# Patient Record
Sex: Female | Born: 1980 | Hispanic: No | Marital: Married | Smoking: Never smoker
Health system: Southern US, Community
[De-identification: ages and names within clinical notes are randomized; demographics above are authoritative.]

## PROBLEM LIST (undated history)

## (undated) DIAGNOSIS — T7840XA Allergy, unspecified, initial encounter: Secondary | ICD-10-CM

## (undated) HISTORY — DX: Allergy, unspecified, initial encounter: T78.40XA

---

## 2006-01-08 HISTORY — PX: APPENDECTOMY: SHX54

## 2006-12-22 ENCOUNTER — Inpatient Hospital Stay (HOSPITAL_COMMUNITY): Admission: EM | Admit: 2006-12-22 | Discharge: 2006-12-23 | Payer: Self-pay | Admitting: Emergency Medicine

## 2006-12-22 ENCOUNTER — Encounter (INDEPENDENT_AMBULATORY_CARE_PROVIDER_SITE_OTHER): Payer: Self-pay | Admitting: General Surgery

## 2008-05-03 ENCOUNTER — Inpatient Hospital Stay (HOSPITAL_COMMUNITY): Admission: AD | Admit: 2008-05-03 | Discharge: 2008-05-03 | Payer: Self-pay | Admitting: Obstetrics and Gynecology

## 2008-05-03 ENCOUNTER — Ambulatory Visit: Payer: Self-pay | Admitting: Obstetrics and Gynecology

## 2008-05-05 ENCOUNTER — Ambulatory Visit: Payer: Self-pay | Admitting: Advanced Practice Midwife

## 2008-05-05 ENCOUNTER — Inpatient Hospital Stay (HOSPITAL_COMMUNITY): Admission: AD | Admit: 2008-05-05 | Discharge: 2008-05-05 | Payer: Self-pay | Admitting: Obstetrics and Gynecology

## 2008-05-15 ENCOUNTER — Inpatient Hospital Stay (HOSPITAL_COMMUNITY): Admission: AD | Admit: 2008-05-15 | Discharge: 2008-05-15 | Payer: Self-pay | Admitting: Obstetrics and Gynecology

## 2008-05-17 ENCOUNTER — Inpatient Hospital Stay (HOSPITAL_COMMUNITY): Admission: AD | Admit: 2008-05-17 | Discharge: 2008-05-20 | Payer: Self-pay | Admitting: Obstetrics and Gynecology

## 2009-03-07 IMAGING — CT CT ABDOMEN W/ CM
2 of 3 series · 17 of 42 positions shown, 19 images · IV contrast (omnipaque)
Comparison: None.

CLINICAL DATA: Upper abdominal pain with nausea and vomiting.
ABDOMEN CT WITH CONTRAST:
TECHNIQUE: Multidetector CT imaging of the abdomen was performed following the standard protocol during bolus administration of intravenous contrast.
Contrast:  125 cc Omnipaque 300 and oral contrast.
TECHNIQUE: Multidetector CT imaging of the pelvis was performed following the standard protocol during bolus administration of intravenous contrast.

[Series 5: abd_pel 5.0 b60f lung · axial · 0.56mm/px · z∈[-160,-34]mm · 14 of 28 slices shown, 16 images]
[im 2/28  soft-tissue]
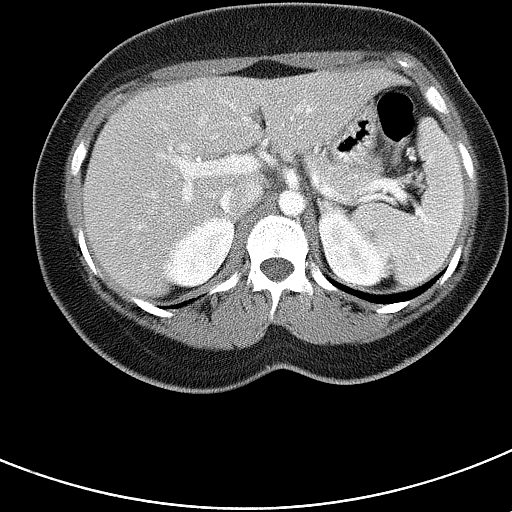
[im 2/28  bone]
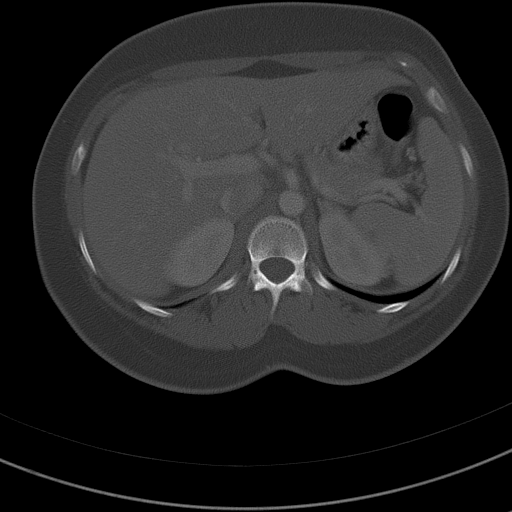
[im 4/28  soft-tissue]
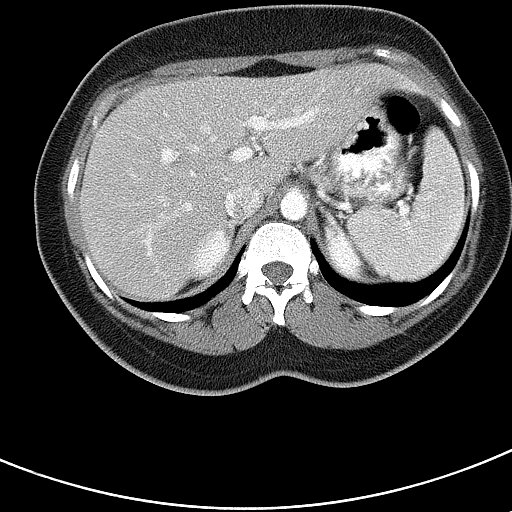
[im 6/28  soft-tissue]
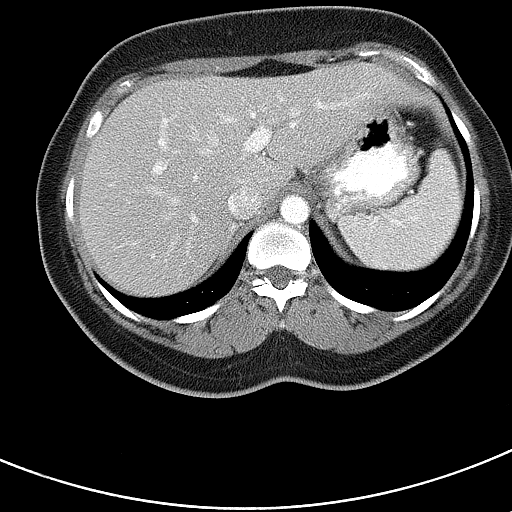
[im 8/28  soft-tissue]
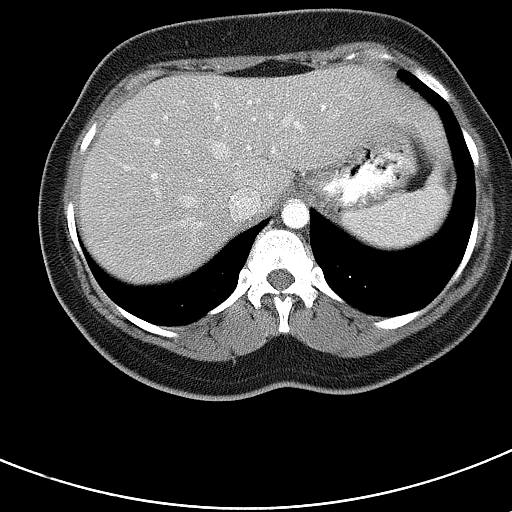
[im 10/28  soft-tissue]
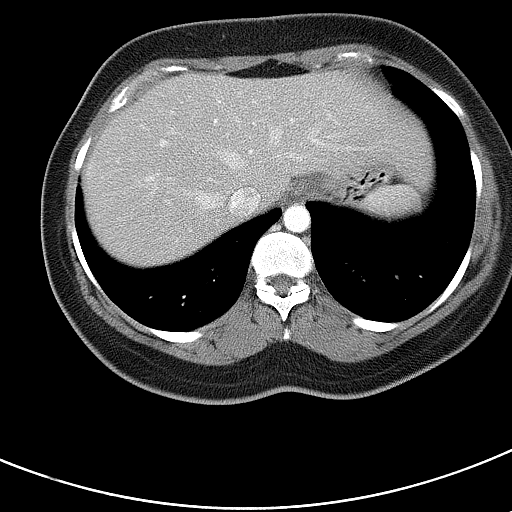
[im 12/28  soft-tissue]
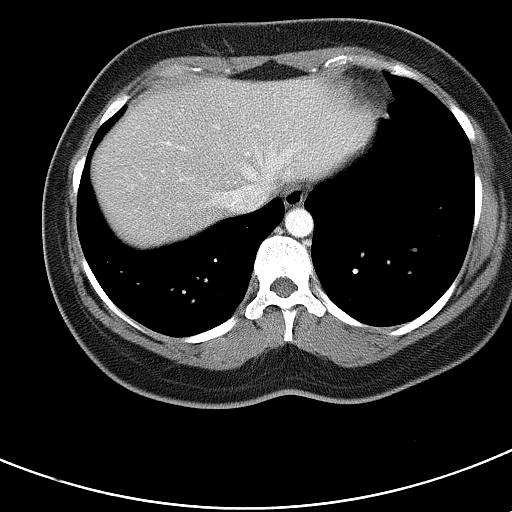
[im 14/28  soft-tissue]
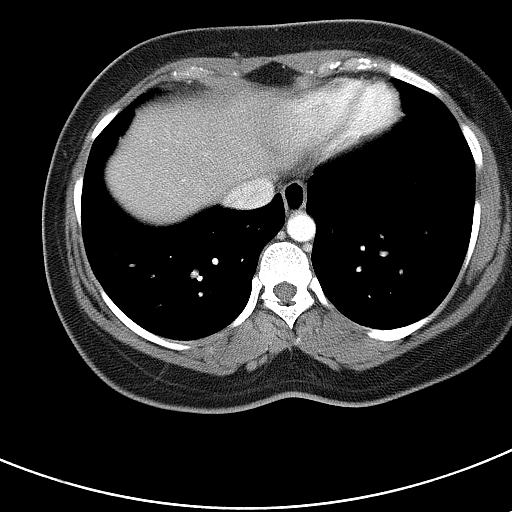
[im 15/28  soft-tissue]
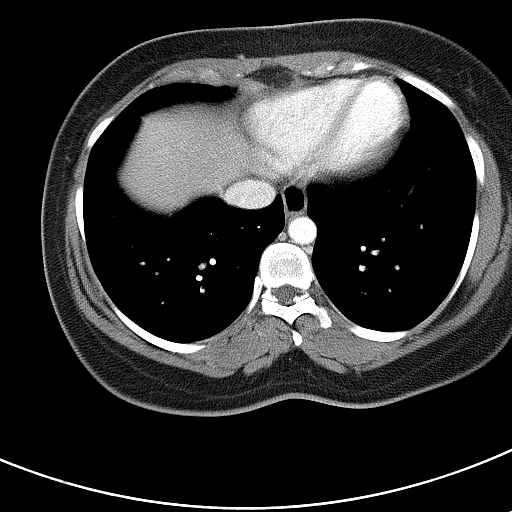
[im 17/28  soft-tissue]
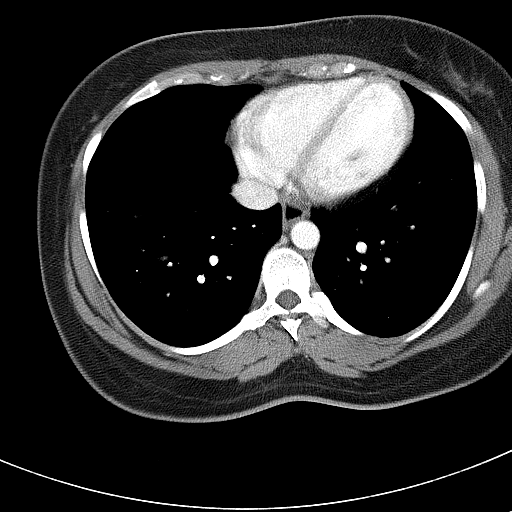
[im 17/28  bone]
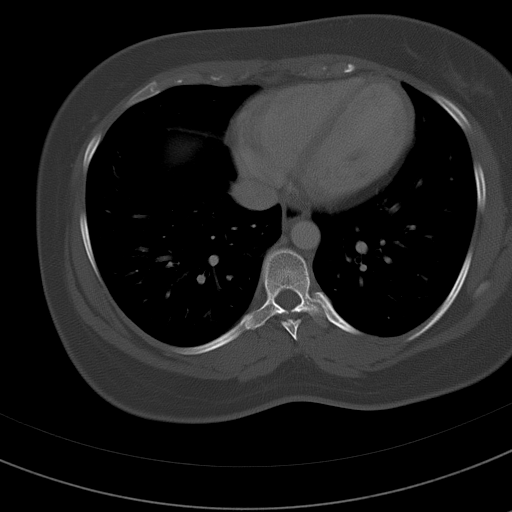
[im 19/28  soft-tissue]
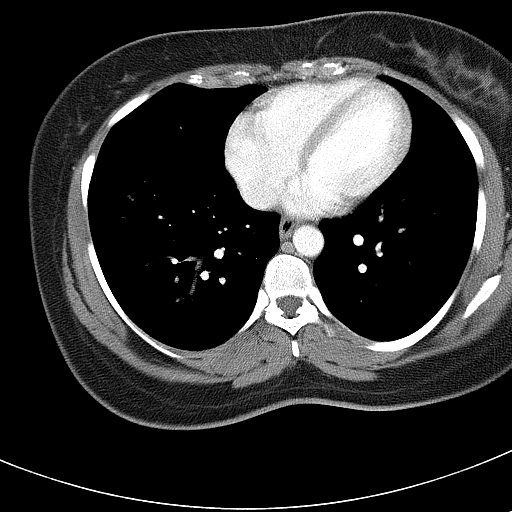
[im 21/28  soft-tissue]
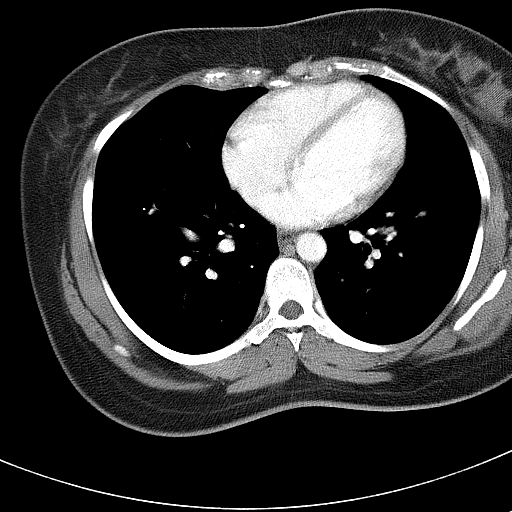
[im 23/28  soft-tissue]
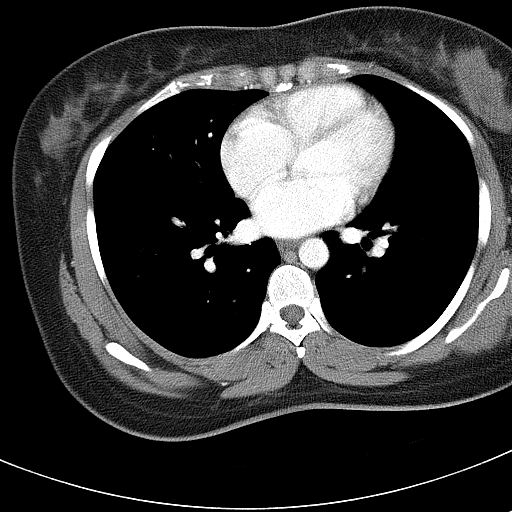
[im 25/28  soft-tissue]
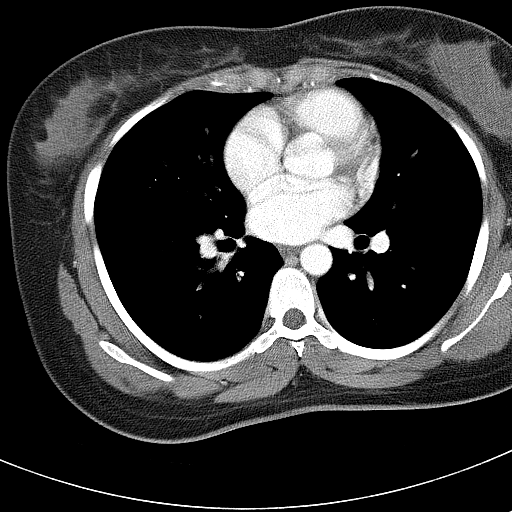
[im 27/28  soft-tissue]
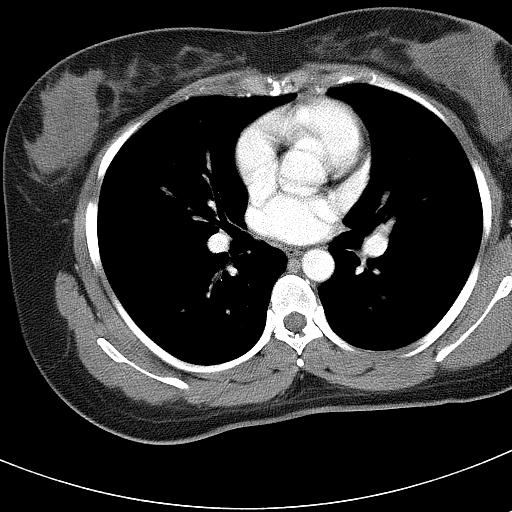

[Series 602: coronal · coronal · 0.87mm/px · 3 of 54 slices shown]
[im 18/54  soft-tissue]
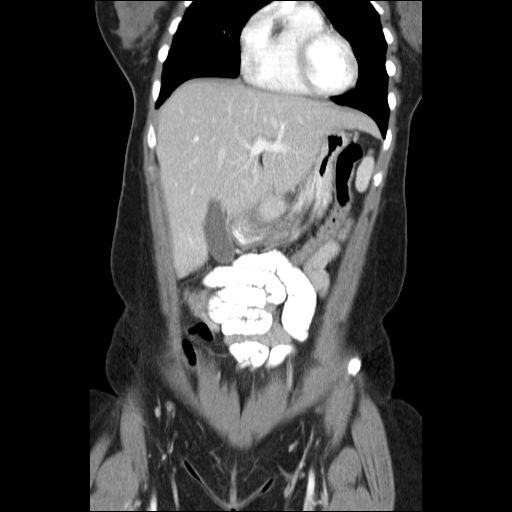
[im 24/54  soft-tissue]
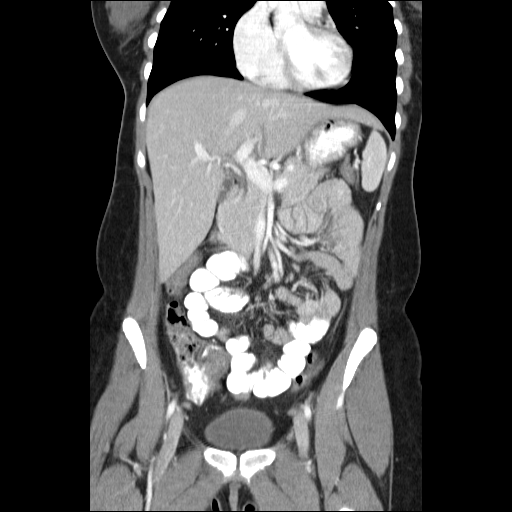
[im 30/54  soft-tissue]
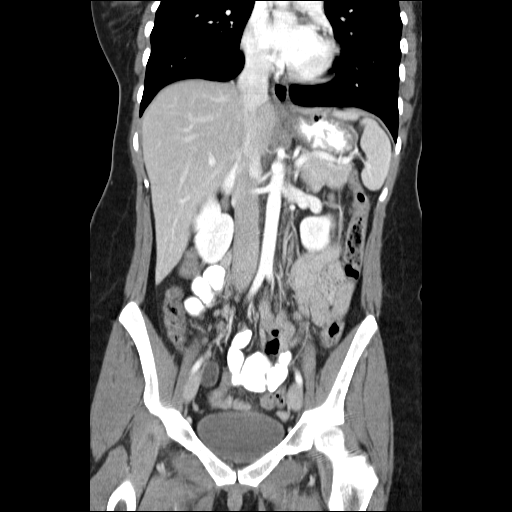

[17 of 42 positions shown; findings below may reference images not displayed]

FINDINGS: The lung bases are clear.  There is no pleural effusion. 
The liver, spleen, gallbladder, pancreas, adrenal glands and kidneys appear normal.  The bowel gas pattern is normal.  There is no evidence of intraabdominal abscess.
IMPRESSION: Negative CT of the abdomen.  See pelvic findings below.
PELVIS CT WITH CONTRAST:
FINDINGS: There is a markedly distended appendix filled with several appendicoliths.  This is demonstrated on images 62 thru 67.  The appendix measures up to 1.3 cm in diameter.  The cecum lies inferiorly in the pelvis, just above the right ovary.  The ovaries appear unremarkable.  The configuration of the uterus suggests a possible septate uterus.  A small amount of free pelvic fluid is present.  There is no evidence of pelvic abscess.
IMPRESSION: 1. Acute appendicitis without evidence of perforation or abscess.
2. Possible septated uterus.
Findings were discussed by telephone with Dr. Xiaojing at the time of interpretation.

## 2010-04-18 LAB — CBC
MCV: 82 fL (ref 78.0–100.0)
RBC: 4.57 MIL/uL (ref 3.87–5.11)
RDW: 18.3 % — ABNORMAL HIGH (ref 11.5–15.5)
WBC: 12 10*3/uL — ABNORMAL HIGH (ref 4.0–10.5)
WBC: 14.6 10*3/uL — ABNORMAL HIGH (ref 4.0–10.5)

## 2010-04-18 LAB — URINALYSIS, ROUTINE W REFLEX MICROSCOPIC
Bilirubin Urine: NEGATIVE
Glucose, UA: NEGATIVE mg/dL
Ketones, ur: NEGATIVE mg/dL
Leukocytes, UA: NEGATIVE

## 2010-04-18 LAB — URINE MICROSCOPIC-ADD ON

## 2010-04-19 LAB — URINE MICROSCOPIC-ADD ON

## 2010-04-19 LAB — URINALYSIS, ROUTINE W REFLEX MICROSCOPIC
Bilirubin Urine: NEGATIVE
Glucose, UA: NEGATIVE mg/dL
Ketones, ur: NEGATIVE mg/dL
Nitrite: NEGATIVE
Protein, ur: NEGATIVE mg/dL
Urobilinogen, UA: 0.2 mg/dL (ref 0.0–1.0)

## 2010-05-23 NOTE — Op Note (Signed)
NAMECODI, FOLKERTS NO.:  [id number]   MEDICAL RECORD NO.:  [id number]          PATIENT TYPE:  INP   LOCATION:  0103                         FACILITY:  [facility]   PHYSICIAN:  Ollen Gross. Vernell Morgans, M.D. DATE OF BIRTH:  1980/05/22   DATE OF PROCEDURE:  12/22/2006  DATE OF DISCHARGE:                               OPERATIVE REPORT   PREOPERATIVE DIAGNOSIS:  Acute appendicitis.   POSTOPERATIVE DIAGNOSIS:  Acute appendicitis.   PROCEDURE:  Laparoscopic appendectomy.   SURGEON:  Ollen Gross. Vernell Morgans, M.D.   ANESTHESIA:  General endotracheal.   PROCEDURE:  After informed consent was obtained, the patient was brought  to the operating room and placed in supine position on the operating  room table.  After adequate induction of general anesthesia, the  patient's abdomen was prepped with Betadine and draped in usual sterile  manner.  The area below the umbilicus infiltrated with 0.25% Marcaine.  A small incision was made with a 15 blade knife.  This incision was  carried down through the subcutaneous tissue bluntly with a hemostat and  Army-Navy retractors until the linea alba was identified.  The linea  alba was incised with a 15 blade knife.  Each side was grasped with  Kocher clamps and elevated anteriorly.  The preperitoneal space was  probed bluntly with a hemostat until the peritoneum was opened and  access was gained to the abdominal cavity.  A 0 Vicryl pursestring  stitch was placed in the fascia surrounding the opening.  A Hasson  cannula was placed through the opening and anchored in place with  previously placed Vicryl pursestring stitch.  The abdomen was then  insufflated carbon dioxide without difficulty.  The patient was placed  in Trendelenburg position and rotated with the right side up.  Next a  suprapubic region was infiltrated with 0.25% Marcaine.  A small incision  was made with a 15 blade knife and a 10 mm port was placed bluntly  through this incision  into the abdominal cavity under direct vision.  The laparoscope was then moved to the suprapubic port.  The right lower  quadrant was inspected with a Glassman grasper and the cecum was  identified and enlarged, inflamed appendix was also identified coming  off the cecum.  Next a 5 mm port site was chosen between the two the  ports.  This area was infiltrated with 0.25% Marcaine.  A small stab  incision was made with a 15 blade knife and 5 mm port was placed bluntly  through this incision into the abdominal cavity under direct vision.  Using a Glassman grasper to grab the appendix and hold up in the air,  the mesoappendix was then taken down sharply with the harmonic scalpel  until the junction of the appendix and the cecum was identified and  cleared of all tissue.  A laparoscopic 45-mm blue load six row stapler  was then placed through the  Hasson cannula across the base the appendix  at its junction with cecum clamped and fired thereby dividing the base  of the appendix between staple lines.  A laparoscopic bag  was then  inserted through the Hasson cannula, the appendix placed within the bag  and bag was sealed.  The bed was inspected.  No other abnormalities were  noted.  The abdomen was irrigated with copious amounts of saline and the  staple line was inspected and found to be hemostatic and intact.  It  looked very good.  The appendix and bag were then removed through the  infraumbilical port with the Hasson cannula without difficulty.  The  fascial defect was closed.  The previously placed Vicryl pursestring  stitch as well as with another figure-of-eight 0 Vicryl stitch.  The  rest of the ports were then removed under direct vision and were found  to be hemostatic.  Gas was allowed to  escape.  The skin incisions were all closed with interrupted 4-0  Monocryl subcuticular stitches.  Dermabond dressings were applied.  The  patient tolerated well.  At the end of the case, all needle,  sponge,  instrument counts correct.  The patient was awakened, taken to recovery  in stable condition.      Ollen Gross. Vernell Morgans, M.D.  Electronically Signed     PST/MEDQ  D:  12/22/2006  T:  12/23/2006  Job:  [id number]

## 2010-05-23 NOTE — H&P (Signed)
NAMEALICYN, KLANN NO.:  [id number]   MEDICAL RECORD NO.:  [id number]          PATIENT TYPE:  INP   LOCATION:  1604                         FACILITY:  [facility]   PHYSICIAN:  Ollen Gross. Vernell Morgans, M.D. DATE OF BIRTH:  11-04-1980   DATE OF ADMISSION:  12/22/2006  DATE OF DISCHARGE:                              HISTORY & PHYSICAL   Ms. Marco Collie is a 30 year old female who presents with right lower  quadrant pain that started this morning. The pain has gotten worse  throughout the day.  The pain has been associated with significant  nausea and vomiting.  She denies any fevers.  No chest pain, shortness  of breath, no diarrhea or dysuria.  Her other review of systems are  unremarkable.   PAST MEDICAL HISTORY:  Is none.   PAST SURGICAL HISTORY:  None.   MEDICATIONS:  None.   ALLERGIES:  ARE TO IBUPROFEN AND PENICILLIN.   SOCIAL HISTORY:  She denies use of alcohol or tobacco products.   FAMILY HISTORY:  Is noncontributory.   PHYSICAL EXAM:  VITAL SIGNS:  Temperature is 97, blood pressure 111/47,  pulse 61.  GENERAL:  She is a well-developed, well-nourished female in no acute  distress.  SKIN:  Warm and dry, no jaundice.  EYES:  Extraocular muscles intact.  Pupils equal, round, reactive to  light.  Sclerae nonicteric.  LUNGS:  Clear bilaterally.  No use of accessory or respiratory muscles.  HEART:  Regular rate and rhythm with an impulse in the left chest.  ABDOMEN:  Soft, but she is focally tender with guarding in the right  lower quadrant.  No palpable mass or hepatosplenomegaly.  EXTREMITIES:  No cyanosis, clubbing or edema with good strength in arms and legs.  MENTAL STATUS:  Psychologically, she is alert and oriented x3 with no  evidence of state or anxiety or depression.   IMPRESSION:  On review of her lab work, it was significant for a white  count of 20,300.   Her CT scan was reviewed with the radiologist and did show an enlarged  inflamed  appendix.   ASSESSMENT/PLAN:  This is a 30 year old female with what appears to be  acute appendicitis.  Because of the risk of rupture and sepsis, I think  she would benefit from having her appendix removed.  I have explained  her in  detail the risks and benefits, the operation to remove the appendix, as  well as some of the technical aspects, and she understands and wishes to  proceed.  Will obtain some routine preoperative lab work in preparation  for doing this for her tonight.      Ollen Gross. Vernell Morgans, M.D.  Electronically Signed     PST/MEDQ  D:  12/22/2006  T:  12/23/2006  Job:  [id number]

## 2010-05-23 NOTE — Op Note (Signed)
Deanna Waller, Deanna Waller      ACCOUNT NO.:  [id number]   MEDICAL RECORD NO.:  [id number]          PATIENT TYPE:  INP   LOCATION:  9148                          FACILITY:  [facility]   PHYSICIAN:  Kendra H. Tenny Craw, MD     DATE OF BIRTH:  06/25/1980   DATE OF PROCEDURE:  05/17/2008  DATE OF DISCHARGE:                               OPERATIVE REPORT   PREOPERATIVE DIAGNOSES:  1. A 40- and 2-week intrauterine pregnancy.  2. Failure to descend.   POSTOPERATIVE DIAGNOSES:  1. A 40- and 2-week intrauterine pregnancy.  2. Failure to descend.   PROCEDURE:  Primary low transverse cesarean section via Pfannenstiel  skin incision.   SURGEON:  Freddrick March. Tenny Craw, MD   ASSISTANT:  None.   ANESTHESIA:  Epidural.   OPERATIVE FINDINGS:  Vigorous female infant in the vertex occiput  posterior presentation weighing 7 pounds 9 ounces with Apgar scores of 9  at 1 minute and 10 and 5 minutes.  Normal-appearing ovaries, tubes, and  uterus.   PROCEDURE:  Deanna Waller is a 30 year old G1, P0 who presented in  the early hours of May 17, 2008, in labor.  She made to complete and  pushing.  She pushed for approximately an hour and 15 minutes with  minimal descent of the infant's head.  It was felt that there was  concern for failure to descend.  The discussion was held with the  patient and her husband about proceeding with another hour of pushing  versus proceeding with a primary cesarean section.  Risks, benefits, and  alternatives were discussed with the patient and her husband and they  elected to proceed with primary cesarean section.  Following the  appropriate informed consent, the patient was brought to the operating  room where epidural anesthesia was confirmed to be adequate.  She was  prepped and draped in the normal sterile fashion.  A scalpel was used to  make a Pfannenstiel skin incision which was carried down through the  underlying layers of soft tissue to the fascia.  The fascia was  incised  in the midline.  The fascial incision was extended laterally with Mayo  scissors.  The superior aspect of the fascial incision was grasped with  Kocher clamps x2, tented up, and the underlying rectus muscle was  dissected off sharply with the electrocautery unit.  The same procedure  was repeated on the inferior aspect of the fascial incision.  Rectus  muscles were then separated in the midline.  The abdominal peritoneum  was identified, tented up, entered sharply with the Metzenbaum and the  incision was extended superiorly and inferiorly with good visualization  of the bladder.  The bladder blade was inserted.  The vesicouterine  peritoneum was identified, tented up, entered sharply with the  Metzenbaum.  The incision was extended laterally with the Metzenbaum and  the bladder flap was created digitally.  A scalpel was then used to make  a low transverse incision on the uterus which was extended laterally  with blunt dissection.  The fetal vertex was noted deep in the pelvis  and was delivered through the uterine incision.  The vertex  was noted to  be in the occiput posterior presentation.  The head followed by the body  were delivered.  The infant cried vigorously on the operative field, it  was bulb suctioned.  Cord was clamped and cut and the infant was passed  to the awaiting pediatricians.  The placenta was then spontaneously  delivered.  Uterus was cleared of all clot and debris.  The uterine  incision was repaired with #1 chromic in a running locked fashion with a  second imbricating layer.  Uterine incision was inspected and found to  be hemostatic.  Ovaries and tubes were inspected and found to be normal.  Uterus was returned to the abdominal cavity.  Abdominal cavity was  cleared of all clot and debris.  The abdominal peritoneum was repaired  with 2-0 Vicryl in a running fashion.  The rectus muscle was then  reapproximated with #1 chromic in a running fashion.  The  fascia was  closed with 0 looped PDS in a running fashion and the skin was closed  with staples.  All sponge, lap, needle counts were correct x2.  The  patient tolerated the procedure well and was brought to the recovery  room in stable condition following the procedure.   ESTIMATED BLOOD LOSS:  700 mL.   COMPLICATIONS:  None.      Freddrick March. Tenny Craw, MD  Electronically Signed     KHR/MEDQ  D:  05/17/2008  T:  05/18/2008  Job:  [id number]

## 2010-05-26 NOTE — Discharge Summary (Signed)
Deanna Waller, Deanna Waller      ACCOUNT NO.:  [id number]   MEDICAL RECORD NO.:  [id number]          PATIENT TYPE:  INP   LOCATION:  9148                          FACILITY:  [facility]   PHYSICIAN:  Randye Lobo, M.D.   DATE OF BIRTH:  02/02/1980   DATE OF ADMISSION:  05/17/2008  DATE OF DISCHARGE:  05/20/2008                               DISCHARGE SUMMARY   FINAL DIAGNOSES:  Intrauterine pregnancy at 40-2/7th weeks' gestation,  active labor, failure to descend.   PROCEDURE:  Primary low transverse cesarean section.   SURGEON:  Freddrick March. Tenny Craw, MD   COMPLICATIONS:  None.   This 30 year old, G1, P0, presents early on May 17, 2008, in early labor  with rupture of membranes.  The patient dilated to complete.  She pushed  for about an hour and 15 minutes with minimal descent of the infant's  head.  There was some concern for failure to descend, and discussion was  held with the patient.  Decision was made to proceed with cesarean  section at this time.  The patient's antepartum course otherwise up to  this point had been uncomplicated.  She did have a negative group B  strep culture obtained in our office at 35 weeks.  The patient was taken  to the operating room on May 17, 2008, by Dr. Waynard Reeds where a  primary low transverse cesarean section was performed with the delivery  of a 7-pound 9-ounce female infant with Apgars of 9 and 10.  Delivery  went without complications.  The patient's postoperative course was  benign without any significant fevers.  The patient was felt ready for  discharge on postoperative day #3.  She was sent home on a regular diet,  told to decrease activities, told to continue her prenatal vitamins and  her iron supplement daily, was given a prescription for Percocet 1-2  every 4-6 hours as needed for pain, told she could use over-the-counter  ibuprofen up to 600 mg every 6 hours as needed for pain, and was to  follow up in our office in 4 weeks.  The patient  was also completing her  prescription for Macrobid secondary to an urinary tract infection.  Instructions and precautions were reviewed with the patient.   LABORATORY DATA ON DISCHARGE:  The patient had a hemoglobin of 10.4,  white blood cell count of 14.6, and platelets of 226,000.      Leilani Able, P.A.-C.      Randye Lobo, M.D.  Electronically Signed    MB/MEDQ  D:  06/02/2008  T:  06/03/2008  Job:  [id number]

## 2010-07-18 IMAGING — US US RENAL
1 series · 14 of 25 positions shown · non-contrast
Comparison: CT 12/22/2006

CLINICAL DATA: Hematuria.  38 weeks pregnant.

RENAL/URINARY TRACT ULTRASOUND COMPLETE

[Series 1: us renal · 14 of 45 slices shown]
[im 1/45]
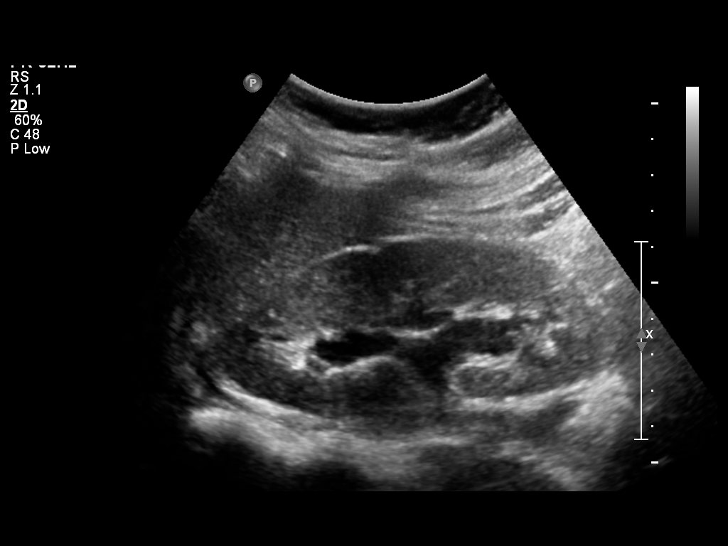
[im 4/45]
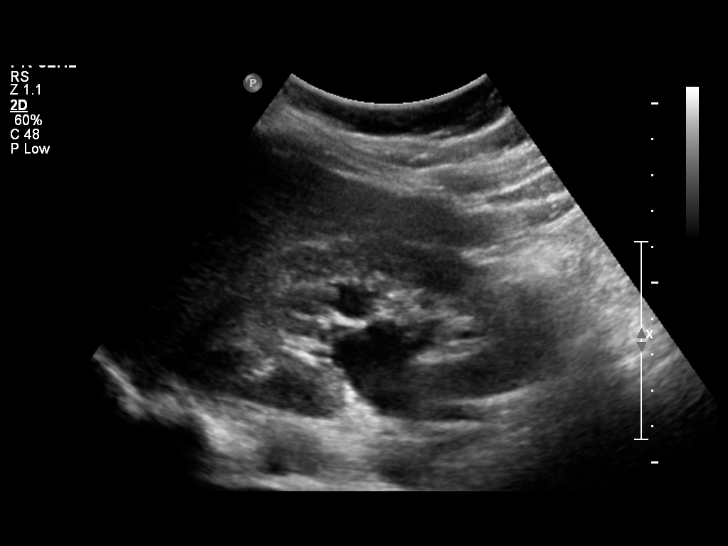
[im 8/45]
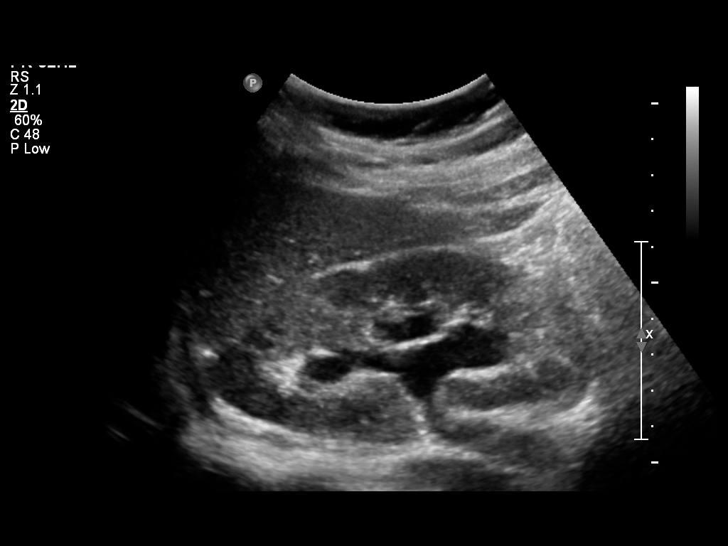
[im 12/45]
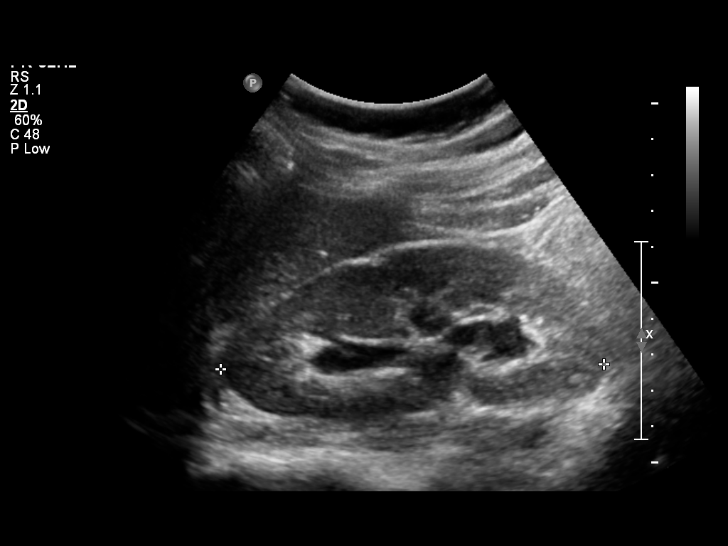
[im 15/45]
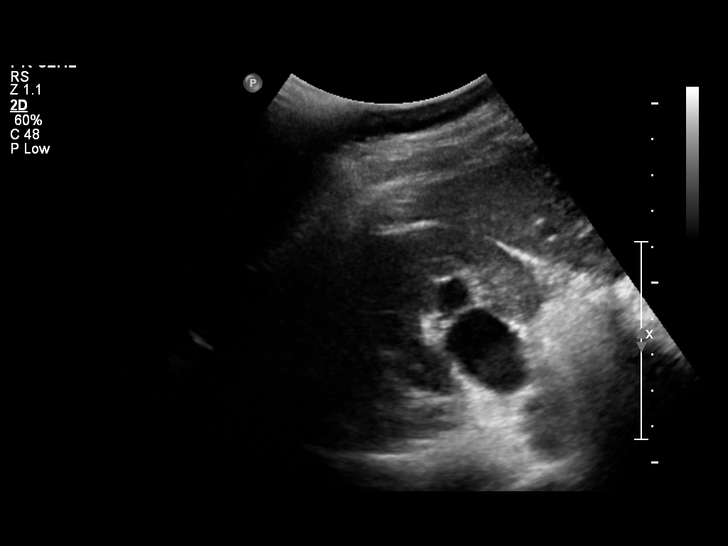
[im 17/45]
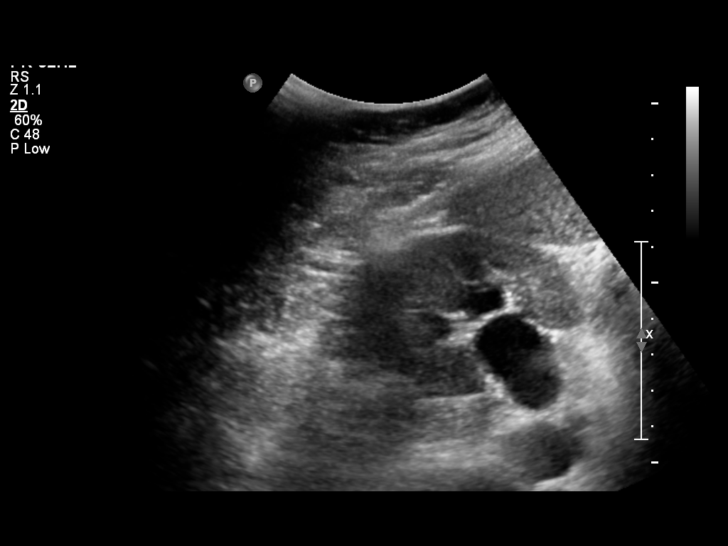
[im 21/45]
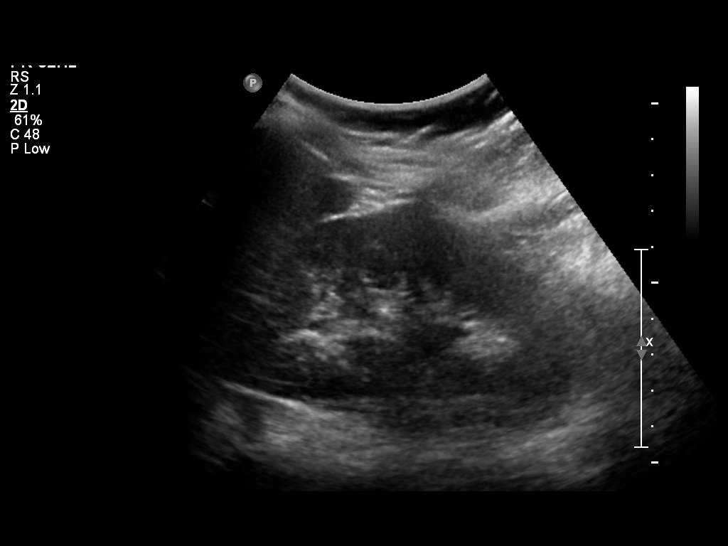
[im 24/45]
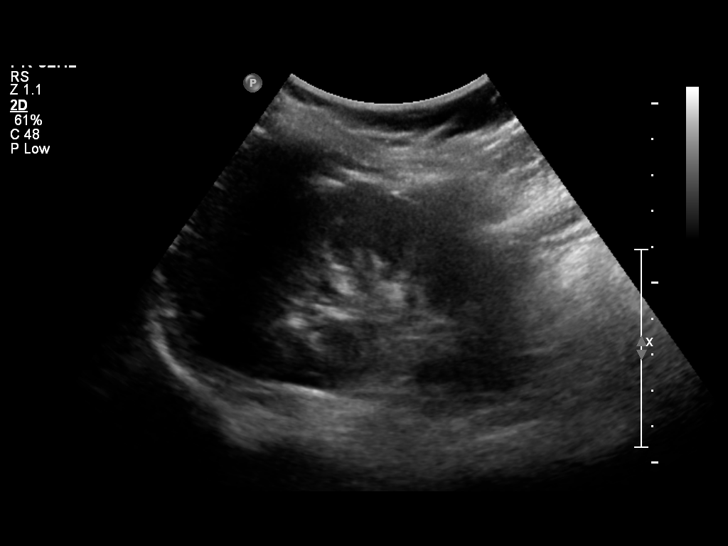
[im 28/45]
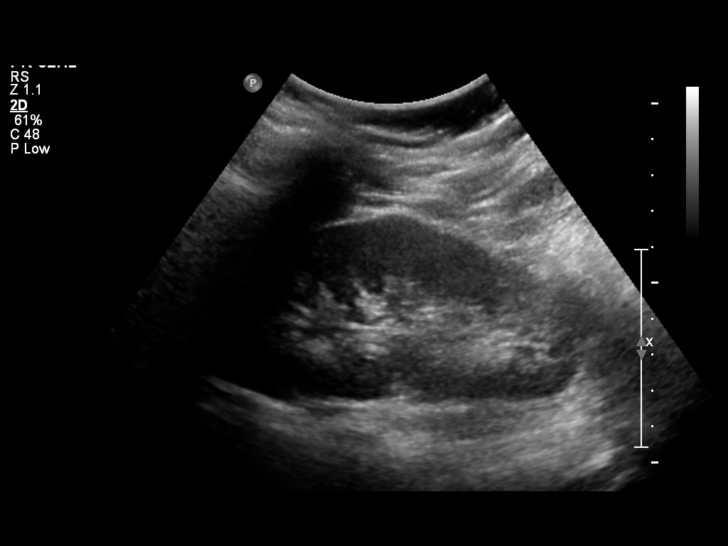
[im 30/45]
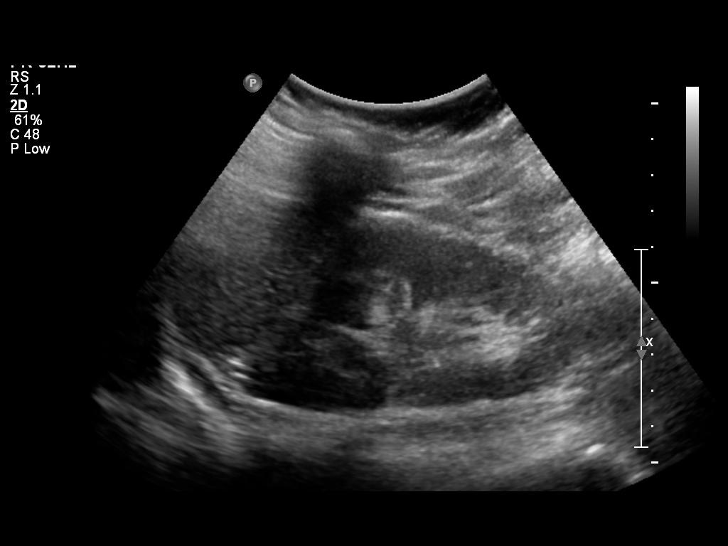
[im 34/45]
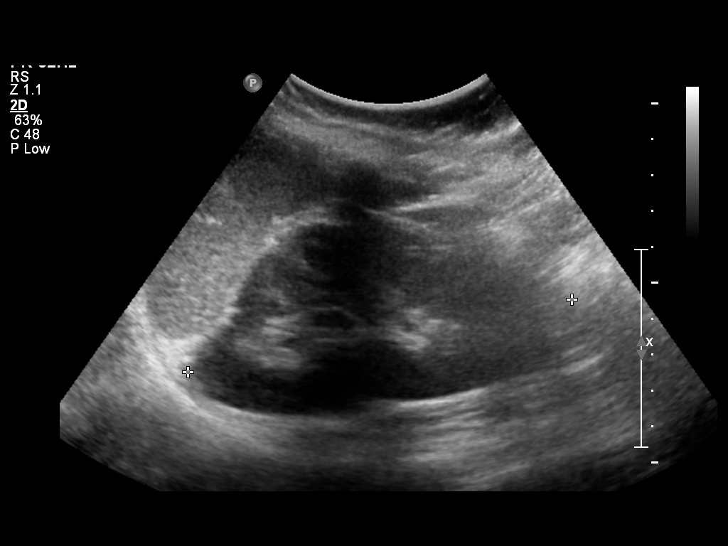
[im 37/45]
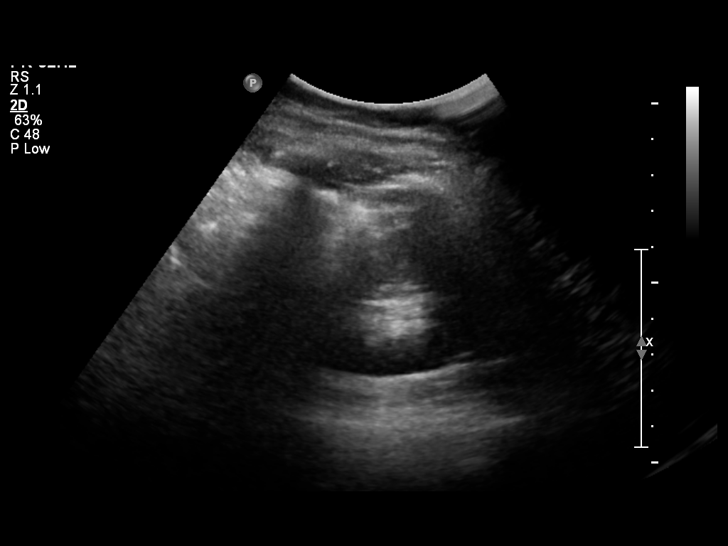
[im 41/45]
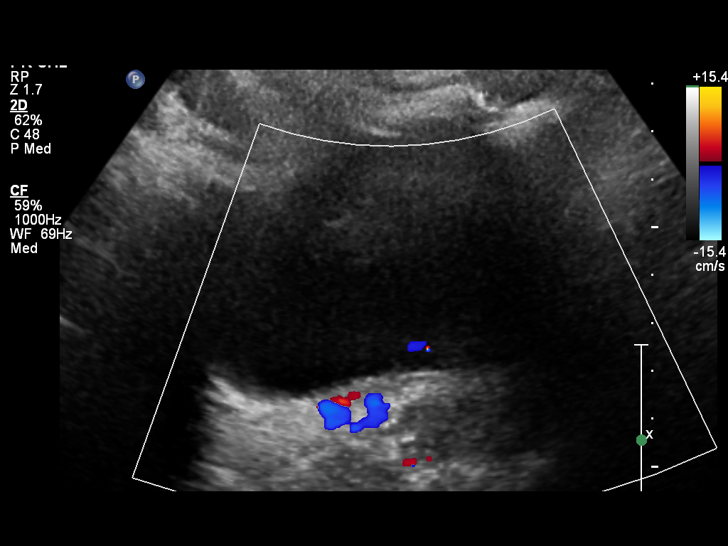
[im 45/45]
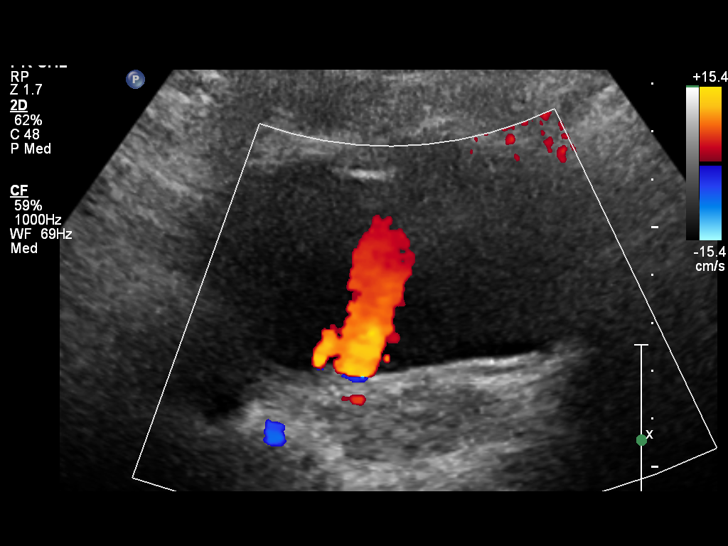

[14 of 25 positions shown; findings below may reference images not displayed]

FINDINGS: Right Kidney:  There is mild right hydronephrosis.  This is most
likely related to pregnancy.  Right kidney measures 2.7 cm.  No
stones or focal abnormality.

Left Kidney:  Left kidney measures 10.7 cm.  No focal abnormality
or hydronephrosis.  No stones.

Bladder:  Bilateral ureteral jets identified.  Bladder
unremarkable.
IMPRESSION: Mild right hydronephrosis, likely related to pregnancy.  Bilateral
ureteral jets identified.

## 2010-10-16 LAB — DIFFERENTIAL
Basophils Absolute: 0
Lymphocytes Relative: 4 — ABNORMAL LOW
Lymphs Abs: 0.8
Monocytes Absolute: 0.2
Neutro Abs: 19.3 — ABNORMAL HIGH
Neutrophils Relative %: 95 — ABNORMAL HIGH

## 2010-10-16 LAB — COMPREHENSIVE METABOLIC PANEL
Alkaline Phosphatase: 67
CO2: 24
Creatinine, Ser: 0.62
Total Bilirubin: 0.6

## 2010-10-16 LAB — CROSSMATCH
ABO/RH(D): O POS
Antibody Screen: NEGATIVE

## 2010-10-16 LAB — CBC
HCT: 37.8
RBC: 4.76
RDW: 14.7
WBC: 20.3 — ABNORMAL HIGH

## 2010-10-16 LAB — LIPASE, BLOOD: Lipase: 17

## 2011-02-12 ENCOUNTER — Ambulatory Visit: Payer: BC Managed Care – PPO | Admitting: Internal Medicine

## 2011-02-12 VITALS — BP 102/58 | HR 70 | Temp 98.4°F | Resp 16 | Ht 59.75 in | Wt 140.8 lb

## 2011-02-12 DIAGNOSIS — J019 Acute sinusitis, unspecified: Secondary | ICD-10-CM

## 2011-02-12 DIAGNOSIS — J329 Chronic sinusitis, unspecified: Secondary | ICD-10-CM

## 2011-02-12 MED ORDER — FLUTICASONE PROPIONATE 50 MCG/ACT NA SUSP: 2.0000 | Freq: Every day | NASAL | Status: DC

## 2011-02-12 MED ORDER — CEFDINIR 300 MG PO CAPS: 300.0000 mg | ORAL_CAPSULE | Freq: Two times a day (BID) | ORAL | Status: AC

## 2011-02-12 NOTE — Patient Instructions (Signed)
Take medications as prescribed.  RTC if not improved in 3 days.

## 2011-02-12 NOTE — Progress Notes (Signed)
  Subjective:    Patient ID: Deanna Waller, female    DOB: 1980-06-29, 31 y.o.   MRN: [id number]  Sinusitis This is a new problem. The current episode started in the past 7 days. The problem has been gradually worsening since onset. There has been no fever. Her pain is at a severity of 2/10. The pain is mild. Associated symptoms include headaches and sinus pressure. Pertinent negatives include no chills, ear pain, neck pain or sore throat. Past treatments include spray decongestants. The treatment provided mild relief.  Patient was using Afrin daily for several days and has no stopped.  Today has slight headache and sinus pressure and post nasal drainage, also feels a bit achy all over.  Denies pregnancy, husband uses condoms.    Review of Systems  Constitutional: Negative for chills.  HENT: Positive for sinus pressure. Negative for ear pain, sore throat and neck pain.   Eyes: Negative.   Respiratory: Negative.   Cardiovascular: Negative.   Gastrointestinal: Negative.   Genitourinary: Negative.   Neurological: Positive for headaches.  Psychiatric/Behavioral: Positive for decreased concentration.       Objective:   Physical Exam  Constitutional: She is oriented to person, place, and time. She appears well-developed and well-nourished.  HENT:  Head: Normocephalic.  Eyes: Pupils are equal, round, and reactive to light.  Neck: Neck supple.  Cardiovascular: Normal rate, regular rhythm and normal heart sounds.   Pulmonary/Chest: No respiratory distress. She has wheezes. She has no rales. She exhibits no tenderness.       Patient with mild expiratory wheezes in left lung field.  Abdominal: Soft.  Musculoskeletal: Normal range of motion.  Neurological: She is alert and oriented to person, place, and time.  Skin: Skin is warm and dry.  Psychiatric: She has a normal mood and affect.          Assessment & Plan:

## 2011-08-29 ENCOUNTER — Ambulatory Visit: Payer: BC Managed Care – PPO | Admitting: Family Medicine

## 2011-08-29 VITALS — BP 88/54 | HR 72 | Temp 98.4°F | Resp 16 | Ht 60.0 in | Wt 140.4 lb

## 2011-08-29 DIAGNOSIS — S43499A Other sprain of unspecified shoulder joint, initial encounter: Secondary | ICD-10-CM

## 2011-08-29 DIAGNOSIS — K140 Glossitis: Secondary | ICD-10-CM

## 2011-08-29 DIAGNOSIS — S46819A Strain of other muscles, fascia and tendons at shoulder and upper arm level, unspecified arm, initial encounter: Secondary | ICD-10-CM

## 2011-08-29 LAB — POCT CBC
Granulocyte percent: 59.2 %G (ref 37–80)
HCT, POC: 43.5 % (ref 37.7–47.9)
Hemoglobin: 13.2 g/dL (ref 12.2–16.2)
MCV: 88.5 fL (ref 80–97)
MPV: 9.3 fL (ref 0–99.8)
POC MID %: 7.3 %M (ref 0–12)
Platelet Count, POC: 367 10*3/uL (ref 142–424)
RBC: 4.91 M/uL (ref 4.04–5.48)
RDW, POC: 14.6 %

## 2011-08-29 NOTE — Progress Notes (Signed)
[facility] [address] [place] [address] [phone]  Date:  08/29/2011   Name:  Deanna Waller   DOB:  09-29-1980   MRN:  [id number]  PCP:  No primary provider on file.    Chief Complaint: Rash and Shoulder Pain   History of Present Illness:  Deanna Waller is a 31 y.o. very pleasant female patient who presents with the following:  She has noted painful bumps on her tongue.  These seem to be better if she takes her vitamins.  She has noted these on and off for a month or so.  She is not sure if they may be stress related.  She has never had these in the past- she has occasionally gotten cheek ulcers.    She also has pain with her left shoulder.  She thought that maybe she slept wrong, but she has been dancing a lot recently- [place] classical dances.  There is a lot of overhead reaching in the dances.  She has pain at night.   She had shoulder pain a couple of years ago, but this got better after she took a Land.    She takes ibuprofen at times.    She had a child/ daughter in 2010.  LMP started 2 days ago.    There is no problem list on file for this patient.   No past medical history on file.  No past surgical history on file.  History  Substance Use Topics  . Smoking status: Never Smoker   . Smokeless tobacco: Not on file  . Alcohol Use: Not on file    No family history on file.  Allergies  Allergen Reactions  . Penicillins Other (See Comments)    Childhood, patient tolerates Amoxicillin    Medication list has been reviewed and updated.  Current Outpatient Prescriptions on File Prior to Visit  Medication Sig Dispense Refill  . cetirizine (ZYRTEC) 10 MG tablet Take 10 mg by mouth daily.      . fluticasone (FLONASE) 50 MCG/ACT nasal spray Place 2 sprays into the nose daily.  16 g  0  . Multiple Vitamin (MULTIVITAMIN) tablet Take 1 tablet by mouth daily.      Marland Kitchen oxymetazoline (AFRIN) 0.05 % nasal spray Place 2  sprays into the nose daily.      . Pseudoephedrine-Acetaminophen (DAYTIME SINUS RELIEF PO) Take by mouth as needed.        Review of Systems:  As per HPI- otherwise negative. She is taking a PNV as they do plan to try and conceive again soon  Physical Examination: Filed Vitals:   08/29/11 0851  BP: 88/54  Pulse: 72  Temp: 98.4 F (36.9 C)  Resp: 16   Filed Vitals:   08/29/11 0851  Height: 5' (1.524 m)  Weight: 140 lb 6.4 oz (63.685 kg)   Body mass index is 27.42 kg/(m^2). Ideal Body Weight: Weight in (lb) to have BMI = 25: 127.7   GEN: WDWN, NAD, Non-toxic, A & O x 3 HEENT: Atraumatic, Normocephalic. Neck supple. No masses, No LAD.  Tm and oropharynx wnl, no visible oral ulcers, a small raw area on the right tip of her tongue.  PEERL, EOMI Ears and Nose: No external deformity. CV: RRR, No M/G/R. No JVD. No thrill. No extra heart sounds. PULM: CTA B, no wheezes, crackles, rhonchi. No retractions. No resp. distress. No accessory muscle use. ABD: S, NT, ND Back/ neck:  She has some tenderness in her left  trapezius muscle.  No redness or swelling.   Left shoulder: no RCT tenderness.  Good ROM, some tenderness in her back with shoulder abduction.   EXTR: No c/c/e NEURO Normal gait.  PSYCH: Normally interactive. Conversant. Not depressed or anxious appearing.  Calm demeanor.    Assessment and Plan: 1. Tongue ulcer  POCT CBC, Vitamin B12, Folate, Vitamin D, 25-hydroxy  2. Trapezius strain     Trapezius strain- she prefers to treat this conservatively.  She will use icy- hot, and OTC pain relievers as needed. Check vitamin levels as above per her request.  Continue PNV as they may try to conceive again soon   Abbe Amsterdam, MD

## 2011-08-31 ENCOUNTER — Encounter: Payer: Self-pay | Admitting: Family Medicine

## 2011-11-19 ENCOUNTER — Other Ambulatory Visit: Payer: Self-pay | Admitting: Obstetrics and Gynecology

## 2012-09-24 ENCOUNTER — Ambulatory Visit: Payer: BC Managed Care – PPO

## 2012-09-24 ENCOUNTER — Ambulatory Visit (INDEPENDENT_AMBULATORY_CARE_PROVIDER_SITE_OTHER): Payer: BC Managed Care – PPO | Admitting: Family Medicine

## 2012-09-24 DIAGNOSIS — J302 Other seasonal allergic rhinitis: Secondary | ICD-10-CM | POA: Insufficient documentation

## 2012-09-24 DIAGNOSIS — M7660 Achilles tendinitis, unspecified leg: Secondary | ICD-10-CM

## 2012-09-24 MED ORDER — MELOXICAM 15 MG PO TABS: 15.0000 mg | ORAL_TABLET | Freq: Every day | ORAL | Status: DC

## 2012-09-24 NOTE — Progress Notes (Signed)
  Subjective:    Patient ID: Deanna Waller, female    DOB: 1980/01/16, 32 y.o.   MRN: [id number]  HPI  This 32 y.o. female presents for evaluation of LEFT ankle pain.  Began suddenly, while reading to her daughter.  "Like I pulled a nerve."  Locates the pain in the achilles tendon area.  Has been doing a boot camp exercise program for 2-3 weeks.  No recalled injury. Pain is worse with pulling her toes up (dorsiflexion).  Pain increases with descending stairs and with weight bearing on the heel.  Medications, allergies, past medical history, surgical history, family history, social history and problem list reviewed.   Review of Systems As above.    Objective:   Physical Exam Blood pressure 100/70, pulse 54, temperature 98.3 F (36.8 C), temperature source Oral, resp. rate 18, height 5' 0.25" (1.53 m), weight 146 lb 12.8 oz (66.588 kg), last menstrual period 09/08/2012, SpO2 100.00%. Body mass index is 28.45 kg/(m^2). Well-developed, well nourished [place] woman who is awake, alert and oriented, in NAD. HEENT: [place]/AT, sclera and conjunctiva are clear.   Heart: Regular rate Lungs: normal effort Extremities: no cyanosis, clubbing or edema. Tenderness along the achilles tendon of the LEFT ankle.  Pain with dorsiflexion.  No tenderness of the calcaneous.  No gastroc tenderness. Skin: warm and dry without rash. Psychologic: good mood and appropriate affect, normal speech and behavior.  HEEL: [facility] reading (PRIMARY) by  Dr. Katrinka Blazing.  Normal calcaneus.  No fracture.      Assessment & Plan:  Achilles tendon pain, left - Plan: DG [place] Left, meloxicam (MOBIC) 15 MG tablet Rest. Ice. CAM walker.  Re-evaluate in 2 weeks if pain persists, sooner if worsens.  Fernande Bras, PA-C Physician Assistant-Certified [facility]

## 2012-09-24 NOTE — Patient Instructions (Addendum)
Do not use ibuprofen or naproxen while using the meloxicam. Do not participate in [facility] until the pain is resolved. Ice the area of the achilles tendon for 15-20 minutes 3-4 times daily. Wear the CAM Walker daily. Return for re-evaluation if pain persists more than 2 weeks, sooner if it worsens.  Achilles Tendinitis Tendinitis a swelling and soreness of the tendon. The pain in the tendon (cord-like structure which attaches muscle to bone) is produced by tiny tears and the inflammation present in that tendon. It commonly occurs at the shoulders, heels, and elbows. It is usually caused by overusing the tendon and joint involved. Achilles tendinitis involves the Achilles tendon. This is the large tendon in the back of the leg just above the foot. It attaches the large muscles of the lower leg to the heel bone (called calcaneus).  This diagnosis (learning what is wrong) is made by examination. X-rays will be generally be normal if only tendinitis is present. HOME CARE INSTRUCTIONS   Apply ice to the injury for 15-20 minutes, 3-4 times per day. Put the ice in a plastic bag and place a towel between the bag of ice and your skin.  Try to avoid use other than gentle range of motion while the tendon is painful. Do not resume use until instructed by your caregiver. Then begin use gradually. Do not increase use to the point of pain. If pain does develop, decrease use and continue the above measures. Gradually increase activities that do not cause discomfort until you gradually achieve normal use.  Only take over-the-counter or prescription medicines for pain, discomfort, or fever as directed by your caregiver. SEEK MEDICAL CARE IF:   Your pain and swelling increase or pain is uncontrolled with medications.  You develop new, unexplained problems (symptoms) or an increase of the symptoms that brought you to your caregiver.  You develop an inability to move your toes or foot, develop warmth and swelling  in your foot, or begin running an unexplained temperature. MAKE SURE YOU:   Understand these instructions.  Will watch your condition.  Will get help right away if you are not doing well or get worse. Document Released: 10/04/2004 Document Revised: 03/19/2011 Document Reviewed: 08/13/2007 [facility] Patient Information 2014 [place], [place].

## 2012-10-28 ENCOUNTER — Ambulatory Visit (INDEPENDENT_AMBULATORY_CARE_PROVIDER_SITE_OTHER): Payer: BC Managed Care – PPO | Admitting: Physician Assistant

## 2012-10-28 VITALS — BP 120/70 | HR 81 | Temp 98.0°F | Resp 16 | Ht 60.5 in | Wt 144.0 lb

## 2012-10-28 DIAGNOSIS — R05 Cough: Secondary | ICD-10-CM

## 2012-10-28 DIAGNOSIS — J309 Allergic rhinitis, unspecified: Secondary | ICD-10-CM

## 2012-10-28 DIAGNOSIS — J302 Other seasonal allergic rhinitis: Secondary | ICD-10-CM

## 2012-10-28 DIAGNOSIS — R062 Wheezing: Secondary | ICD-10-CM

## 2012-10-28 MED ORDER — GUAIFENESIN ER 1200 MG PO TB12: 1.0000 | ORAL_TABLET | Freq: Two times a day (BID) | ORAL | Status: AC

## 2012-10-28 MED ORDER — FLUTICASONE PROPIONATE 50 MCG/ACT NA SUSP: 2.0000 | Freq: Every day | NASAL | Status: AC

## 2012-10-28 MED ORDER — BENZONATATE 100 MG PO CAPS: ORAL_CAPSULE | ORAL | Status: AC

## 2012-10-28 MED ORDER — ALBUTEROL SULFATE HFA 108 (90 BASE) MCG/ACT IN AERS: 2.0000 | INHALATION_SPRAY | Freq: Four times a day (QID) | RESPIRATORY_TRACT | Status: AC | PRN

## 2012-10-28 NOTE — Progress Notes (Signed)
   [address], [place] [address]   Phone [phone]  Subjective:    Patient ID: Deanna Waller, female    DOB: 06/22/1980, 32 y.o.   MRN: [id number]  HPI Pt presents to clinic with allergies for the last 2 days - they are getting worse.  She has been on Flonase in the past and it works well but she has run out.  She has been exposed to her children who have been sick with cold but this feels like allergies to her.  She has a cough with sputum production but she feels like it is coming from her throat.  She also feels like the cough is like a tickle in the throat.  She has PND and sneezing.  She has felt like she has been wheezing but she has no h/o asthma.  OTC - Zyrtec and cold prep  Review of Systems  Constitutional: Negative for fever and chills.  HENT: Positive for congestion, postnasal drip, sneezing and sore throat. Negative for rhinorrhea.   Respiratory: Positive for cough (sputum - yellow in the am then clears throughout the day).   Gastrointestinal: Negative for nausea, vomiting and diarrhea.  Musculoskeletal: Negative for myalgias.  Allergic/Immunologic: Positive for environmental allergies.  Neurological: Negative for headaches.       Objective:   Physical Exam  Vitals reviewed. Constitutional: She is oriented to person, place, and time. She appears well-developed and well-nourished.  HENT:  Head: Normocephalic and atraumatic.  Right Ear: Hearing, tympanic membrane, external ear and ear canal normal.  Left Ear: Hearing, tympanic membrane, external ear and ear canal normal.  Nose: Mucosal edema (very pale and touching septum on the Right side) present.  Mouth/Throat: Uvula is midline, oropharynx is clear and moist and mucous membranes are normal.  Eyes: Conjunctivae are normal. Pupils are equal, round, and reactive to light.  Cardiovascular: Normal rate, regular rhythm and normal heart sounds.   No murmur heard. Pulmonary/Chest: Effort normal. She has  wheezes (expiratory wheezing that is worse with forced expiration and worse at the lung bases).  Neurological: She is alert and oriented to person, place, and time.  Skin: Skin is warm and dry.  Psychiatric: She has a normal mood and affect. Her behavior is normal. Judgment and thought content normal.       Assessment & Plan:  Seasonal allergies - Plan: fluticasone (FLONASE) 50 MCG/ACT nasal spray  Cough - Plan: Guaifenesin (MUCINEX MAXIMUM STRENGTH) 1200 MG TB12, benzonatate (TESSALON) 100 MG capsule  Wheezing - Plan: albuterol (PROVENTIL HFA;VENTOLIN HFA) 108 (90 BASE) MCG/ACT inhaler  Benny Lennert PA-C 10/28/2012 1:49 PM

## 2013-11-25 ENCOUNTER — Ambulatory Visit (INDEPENDENT_AMBULATORY_CARE_PROVIDER_SITE_OTHER): Payer: BC Managed Care – PPO | Admitting: Physician Assistant

## 2013-11-25 VITALS — BP 100/70 | HR 44 | Temp 97.9°F | Resp 16 | Ht 60.0 in | Wt 149.6 lb

## 2013-11-25 DIAGNOSIS — M546 Pain in thoracic spine: Secondary | ICD-10-CM

## 2013-11-25 DIAGNOSIS — J302 Other seasonal allergic rhinitis: Secondary | ICD-10-CM

## 2013-11-25 MED ORDER — IBUPROFEN 600 MG PO TABS: 600.0000 mg | ORAL_TABLET | Freq: Three times a day (TID) | ORAL | Status: AC | PRN

## 2013-11-25 MED ORDER — CYCLOBENZAPRINE HCL 5 MG PO TABS: 5.0000 mg | ORAL_TABLET | Freq: Every evening | ORAL | Status: AC | PRN

## 2013-11-25 NOTE — Patient Instructions (Signed)
Back Pain, Adult Low back pain is very common. About 1 in 5 people have back pain.The cause of low back pain is rarely dangerous. The pain often gets better over time.About half of people with a sudden onset of back pain feel better in just 2 weeks. About 8 in 10 people feel better by 6 weeks.  CAUSES Some common causes of back pain include:  Strain of the muscles or ligaments supporting the spine.  Wear and tear (degeneration) of the spinal discs.  Arthritis.  Direct injury to the back. DIAGNOSIS Most of the time, the direct cause of low back pain is not known.However, back pain can be treated effectively even when the exact cause of the pain is unknown.Answering your caregiver's questions about your overall health and symptoms is one of the most accurate ways to make sure the cause of your pain is not dangerous. If your caregiver needs more information, he or she may order lab work or imaging tests (X-rays or MRIs).However, even if imaging tests show changes in your back, this usually does not require surgery. HOME CARE INSTRUCTIONS For many people, back pain returns.Since low back pain is rarely dangerous, it is often a condition that people can learn to manageon their own.   Remain active. It is stressful on the back to sit or stand in one place. Do not sit, drive, or stand in one place for more than 30 minutes at a time. Take short walks on level surfaces as soon as pain allows.Try to increase the length of time you walk each day.  Do not stay in bed.Resting more than 1 or 2 days can delay your recovery.  Do not avoid exercise or work.Your body is made to move.It is not dangerous to be active, even though your back may hurt.Your back will likely heal faster if you return to being active before your pain is gone.  Pay attention to your body when you bend and lift. Many people have less discomfortwhen lifting if they bend their knees, keep the load close to their bodies,and  avoid twisting. Often, the most comfortable positions are those that put less stress on your recovering back.  Find a comfortable position to sleep. Use a firm mattress and lie on your side with your knees slightly bent. If you lie on your back, put a pillow under your knees.  Only take over-the-counter or prescription medicines as directed by your caregiver. Over-the-counter medicines to reduce pain and inflammation are often the most helpful.Your caregiver may prescribe muscle relaxant drugs.These medicines help dull your pain so you can more quickly return to your normal activities and healthy exercise.  Put ice on the injured area.  Put ice in a plastic bag.  Place a towel between your skin and the bag.  Leave the ice on for 15-20 minutes, 03-04 times a day for the first 2 to 3 days. After that, ice and heat may be alternated to reduce pain and spasms.  Ask your caregiver about trying back exercises and gentle massage. This may be of some benefit.  Avoid feeling anxious or stressed.Stress increases muscle tension and can worsen back pain.It is important to recognize when you are anxious or stressed and learn ways to manage it.Exercise is a great option. SEEK MEDICAL CARE IF:  You have pain that is not relieved with rest or medicine.  You have pain that does not improve in 1 week.  You have new symptoms.  You are generally not feeling well. SEEK   IMMEDIATE MEDICAL CARE IF:   You have pain that radiates from your back into your legs.  You develop new bowel or bladder control problems.  You have unusual weakness or numbness in your arms or legs.  You develop nausea or vomiting.  You develop abdominal pain.  You feel faint. Document Released: 12/25/2004 Document Revised: 06/26/2011 Document Reviewed: 04/28/2013 [employer] Patient Information 2015 [employer]. This information is not intended to replace advice given to you by your health care provider. Make sure you  discuss any questions you have with your health care provider.  

## 2013-11-25 NOTE — Progress Notes (Signed)
Subjective:    Patient ID: Deanna Waller, female    DOB: 13-Dec-1980, 33 y.o.   MRN: [id number]  HPI Patient presents with 5 days of L shoulder pain that is 7/10, achy to throbbing in quality, and does not radiate. Denies numbness/tingling of fingers/hand. Denies trauma, but has had increase in activity as she is practicing for a performance in traditional [place] dance and doing more boot camp. Worse at night and with inactivity. Better with heating pad and ibuprofen. Only takes 400 mg of ibuprofen in the morning. Doesn't take at night bc didn't want to take too much medicine. Has happened before in 2011 and was related to carrying infant daughter.   Has been sneezing and had nasal congestion with change in weather. Would like to know if ok to continue to take Zyrtec and wondering how often she should take Flonase. Denies fever or cough. Does not have time for exam of HENT as she has to pick up daughter and says she has been waiting too long.  No medication allergies.     Review of Systems  Constitutional: Positive for activity change (increased). Negative for fever and appetite change.  HENT: Positive for congestion and sneezing.   Respiratory: Negative for cough.   Musculoskeletal: Positive for back pain (upper). Negative for joint swelling, arthralgias, neck pain and neck stiffness.  Neurological: Negative for headaches.       Objective:   Physical Exam  Constitutional: She is oriented to person, place, and time. She appears well-developed and well-nourished. No distress.  Blood pressure 100/70, pulse 44, temperature 97.9 F (36.6 C), temperature source Oral, resp. rate 16, height 5' (1.524 m), weight 149 lb 9.6 oz (67.858 kg), last menstrual period 11/21/2013, SpO2 98 %.   HENT:  Head: Normocephalic and atraumatic.  Right Ear: External ear normal.  Left Ear: External ear normal.  Eyes: Right eye exhibits no discharge. Left eye exhibits no discharge. No scleral icterus.    Neck: Normal range of motion. Neck supple.  Cardiovascular: Normal rate, regular rhythm and normal heart sounds.  Exam reveals no gallop and no friction rub.   No murmur heard. Pulmonary/Chest: Effort normal and breath sounds normal. She has no wheezes. She has no rales.  Musculoskeletal: Normal range of motion. She exhibits no edema or tenderness.       Right shoulder: Normal.       Left shoulder: Normal.       Arms: Upper left back- non-tender with palpation. No erythema, swelling, or deformity.  Lymphadenopathy:    She has no cervical adenopathy.  Neurological: She is alert and oriented to person, place, and time. She has normal strength and normal reflexes. She displays no atrophy. A sensory deficit is present. No cranial nerve deficit. She exhibits normal muscle tone. Coordination normal.  Skin: Skin is warm and dry. No rash noted. She is not diaphoretic. No erythema. No pallor.        Assessment & Plan:  1. Left-sided thoracic back pain - cyclobenzaprine (FLEXERIL) 5 MG tablet; Take 1 tablet (5 mg total) by mouth at bedtime as needed for muscle spasms.  Dispense: 20 tablet; Refill: 0 - ibuprofen (ADVIL,MOTRIN) 600 MG tablet; Take 1 tablet (600 mg total) by mouth every 8 (eight) hours as needed.  Dispense: 30 tablet; Refill: 0 - Heating pad or ice for additional pain relief. - Do not have to stop practicing, but should avoid moves that will exacerbate the area. Do not remain immobile.   2. Seasonal  allergies Continue current medication regimen of Zyrtec and Flonase.   Janan Ridgeishira Myrella Fahs PA-C  [facility] 11/25/2013 4:23 PM

## 2013-11-30 NOTE — Progress Notes (Signed)
I have discussed this case with Ms. Brewington, PA-C and agree.
